# Patient Record
Sex: Male | Born: 1980 | Race: White | Hispanic: No | Marital: Married | Smoking: Never smoker
Health system: Southern US, Community
[De-identification: ages and names within clinical notes are randomized; demographics above are authoritative.]

## PROBLEM LIST (undated history)

## (undated) DIAGNOSIS — N200 Calculus of kidney: Secondary | ICD-10-CM

## (undated) HISTORY — PX: OTHER SURGICAL HISTORY: SHX169

---
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
## 1998-06-21 HISTORY — PX: NASAL SEPTUM SURGERY: SHX37

## 2007-06-22 HISTORY — PX: VASECTOMY: SHX75

## 2013-01-23 ENCOUNTER — Emergency Department: Payer: Self-pay | Admitting: Emergency Medicine

## 2013-01-23 LAB — BASIC METABOLIC PANEL
BUN: 14 mg/dL (ref 7–18)
Chloride: 104 mmol/L (ref 98–107)
Co2: 27 mmol/L (ref 21–32)
EGFR (African American): >60
EGFR (Non-African Amer.): >60
Glucose: 113 mg/dL — ABNORMAL HIGH (ref 65–99)
Osmolality: 277 (ref 275–301)

## 2013-01-23 LAB — CBC
MCH: 30.2 pg (ref 26.0–34.0)
MCV: 88 fL (ref 80–100)
Platelet: 226 10*3/uL (ref 150–440)
RBC: 4.88 10*6/uL (ref 4.40–5.90)
RDW: 12.6 % (ref 11.5–14.5)
WBC: 11.2 10*3/uL — ABNORMAL HIGH (ref 3.8–10.6)

## 2013-01-24 LAB — URINALYSIS, COMPLETE
Glucose,UR: NEGATIVE mg/dL (ref 0–75)
Hyaline Cast: 2
Ketone: NEGATIVE
Nitrite: NEGATIVE
Ph: 5 (ref 4.5–8.0)
RBC,UR: 90 /HPF (ref 0–5)
Specific Gravity: 1.025 (ref 1.003–1.030)
Squamous Epithelial: <1
WBC UR: 6 /HPF (ref 0–5)

## 2013-06-27 ENCOUNTER — Other Ambulatory Visit: Payer: Self-pay | Admitting: Urology

## 2013-06-28 ENCOUNTER — Encounter (HOSPITAL_COMMUNITY): Payer: Self-pay | Admitting: *Deleted

## 2013-07-05 ENCOUNTER — Encounter (HOSPITAL_COMMUNITY)
Admission: RE | Disposition: A | Discharge status: Home / Self Care | Payer: Self-pay | Source: Ambulatory Visit | Attending: Urology

## 2013-07-05 ENCOUNTER — Ambulatory Visit (HOSPITAL_COMMUNITY)
Admission: RE | Admit: 2013-07-05 | Discharge: 2013-07-05 | Disposition: A | Discharge status: Home / Self Care | Payer: BC Managed Care – PPO | Source: Ambulatory Visit | Attending: Urology | Admitting: Urology

## 2013-07-05 ENCOUNTER — Encounter (HOSPITAL_COMMUNITY): Payer: Self-pay | Admitting: Pharmacy Technician

## 2013-07-05 ENCOUNTER — Ambulatory Visit (HOSPITAL_COMMUNITY): Payer: BC Managed Care – PPO

## 2013-07-05 DIAGNOSIS — N200 Calculus of kidney: Secondary | ICD-10-CM | POA: Insufficient documentation

## 2013-07-05 DIAGNOSIS — Z9852 Vasectomy status: Secondary | ICD-10-CM | POA: Insufficient documentation

## 2013-07-05 HISTORY — DX: Calculus of kidney: N20.0

## 2013-07-05 SURGERY — LITHOTRIPSY, ESWL
Anesthesia: LOCAL | Laterality: Left

## 2013-07-05 MED ORDER — SODIUM CHLORIDE 0.9 % IV SOLN
INTRAVENOUS | Status: DC
  Administered 2013-07-05: 09:00:00 via INTRAVENOUS

## 2013-07-05 MED ORDER — GENTAMICIN IN SALINE 1.6-0.9 MG/ML-% IV SOLN
80.0000 mg | INTRAVENOUS | Status: AC
  Administered 2013-07-05: 80 mg via INTRAVENOUS
  Filled 2013-07-05 (×2): qty 50

## 2013-07-05 MED ORDER — OXYCODONE-ACETAMINOPHEN 5-325 MG PO TABS: 1.0000 | ORAL_TABLET | ORAL | Status: AC | PRN

## 2013-07-05 MED ORDER — DIAZEPAM 5 MG PO TABS
10.0000 mg | ORAL_TABLET | ORAL | Status: AC
  Administered 2013-07-05: 10 mg via ORAL
  Filled 2013-07-05: qty 2

## 2013-07-05 MED ORDER — SENNOSIDES-DOCUSATE SODIUM 8.6-50 MG PO TABS: 1.0000 | ORAL_TABLET | Freq: Two times a day (BID) | ORAL | Status: AC

## 2013-07-05 MED ORDER — DIPHENHYDRAMINE HCL 25 MG PO CAPS
25.0000 mg | ORAL_CAPSULE | ORAL | Status: AC
  Administered 2013-07-05: 25 mg via ORAL
  Filled 2013-07-05: qty 1

## 2013-07-05 NOTE — H&P (Signed)
Alex Cook is an 33 y.o. male.    Chief Complaint: Pre-op Left Shockwave Lithotripsy  HPI:   1 - Nephrolithiasis - Pt with first episode distal left ureteral stone 01/2013 per history presenting as hematuria and colicky left flank pain by CT in [place]. Symptoms have persisted on/ off since then without clear stone passage. CT (06/2013) with left 11mm UPJ stone with mild hydro, 600 HU, SSD 9 cm. Visible on scout images.   PMH sig for bilateral knee surgery, deviated septum surgery. No CV disease. 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  Today Alex Cook is seen to proceed with left shockwave lithotripsy for his likely intermitanly obstructing left UPJ stone. Most recent UA without infectious parameters.  Past Medical History  Diagnosis Date  . Renal stone     Past Surgical History  Procedure Laterality Date  . Hand surgery  1998  . Nasal septum surgery  2000  . Torn patella tendon repair  Bilateral 2002, 2003  . Vasectomy  2009    History reviewed. No pertinent family history. Social History:  reports that he has never smoked. He does not have any smokeless tobacco history on file. He reports that he does not drink alcohol. His drug history is not on file.  Allergies: No Known Allergies  No prescriptions prior to admission    No results found for this or any previous visit (from the past 48 hour(s)). No results found.  Review of Systems  Constitutional: Negative.  Negative for fever and chills.  HENT: Negative.   Eyes: Negative.   Respiratory: Negative.   Cardiovascular: Negative.   Gastrointestinal: Positive for nausea. Negative for vomiting.  Genitourinary: Positive for flank pain.  Musculoskeletal: Negative.   Skin: Negative.   Neurological: Negative.   Endo/Heme/Allergies: Negative.   Psychiatric/Behavioral: Negative.     There were no vitals taken for this visit. Physical Exam  Constitutional: He is oriented to person, place, and time. He appears well-developed  and well-nourished.  HENT:  Head: Normocephalic and atraumatic.  Eyes: EOM are normal. Pupils are equal, round, and reactive to light.  Neck: Normal range of motion. Neck supple.  Cardiovascular: Normal rate.   Respiratory: Effort normal.  GI: Soft. Bowel sounds are normal.  Genitourinary: Penis normal.  Moderate Left CVAT  Musculoskeletal: Normal range of motion.  Neurological: He is alert and oriented to person, place, and time.  Skin: Skin is warm and dry.  Psychiatric: He has a normal mood and affect. His behavior is normal. Judgment and thought content normal.     Assessment/Plan  1 - Nephrolithiasis - We rediscussed shockwave lithotripsy in detail as well as my "rule of 9s" with stones <749mm, less than 900 HU, and skin to stone distance <9cm having approximately 90% treatment success with single session of treatment. We then readdressed how stones that are larger, more dense, and in patients with less favorable anatomy have incrementally decreased success rates. We rediscussed risks including, bleeding, infection, hematoma, loss of kidney, need for staged therapy, need for adjunctive therapy and requirement to refrain from any anticoagulants, anti-platelet or aspirin-like products peri-procedureally.   After careful consideration, the patient has chosen to proceed today as planned.  Raegen Tarpley 07/05/2013, 6:19 AM

## 2013-07-05 NOTE — Discharge Instructions (Signed)
1 - You may have urinary urgency (bladder spasms) and bloody urine on / off and pass small fragments for up to 2 weeks. This is normal.  2 - Call MD or go to ER for fever >102, severe pain / nausea / vomiting not relieved by medications, or acute change in medical status

## 2013-07-08 ENCOUNTER — Emergency Department (HOSPITAL_COMMUNITY)
Admission: EM | Admit: 2013-07-08 | Discharge: 2013-07-08 | Disposition: A | Discharge status: Home / Self Care | Payer: BC Managed Care – PPO | Attending: Emergency Medicine | Admitting: Emergency Medicine

## 2013-07-08 ENCOUNTER — Encounter (HOSPITAL_COMMUNITY): Payer: Self-pay | Admitting: Emergency Medicine

## 2013-07-08 ENCOUNTER — Emergency Department (HOSPITAL_COMMUNITY): Payer: BC Managed Care – PPO

## 2013-07-08 DIAGNOSIS — G8929 Other chronic pain: Secondary | ICD-10-CM | POA: Insufficient documentation

## 2013-07-08 DIAGNOSIS — R112 Nausea with vomiting, unspecified: Secondary | ICD-10-CM | POA: Insufficient documentation

## 2013-07-08 DIAGNOSIS — Z79899 Other long term (current) drug therapy: Secondary | ICD-10-CM | POA: Insufficient documentation

## 2013-07-08 DIAGNOSIS — Z9889 Other specified postprocedural states: Secondary | ICD-10-CM | POA: Insufficient documentation

## 2013-07-08 DIAGNOSIS — N200 Calculus of kidney: Secondary | ICD-10-CM

## 2013-07-08 DIAGNOSIS — R000 Tachycardia, unspecified: Secondary | ICD-10-CM | POA: Insufficient documentation

## 2013-07-08 DIAGNOSIS — N179 Acute kidney failure, unspecified: Secondary | ICD-10-CM

## 2013-07-08 DIAGNOSIS — R109 Unspecified abdominal pain: Secondary | ICD-10-CM

## 2013-07-08 LAB — BASIC METABOLIC PANEL
BUN: 18 mg/dL (ref 6–23)
CO2: 26 mEq/L (ref 19–32)
Calcium: 9.6 mg/dL (ref 8.4–10.5)
Chloride: 94 mEq/L — ABNORMAL LOW (ref 96–112)
Creatinine, Ser: 1.7 mg/dL — ABNORMAL HIGH (ref 0.50–1.35)
GFR, EST AFRICAN AMERICAN: 59 mL/min — AB (ref >90)
GFR, EST NON AFRICAN AMERICAN: 51 mL/min — AB (ref >90)
Glucose, Bld: 95 mg/dL (ref 70–99)
POTASSIUM: 3.8 meq/L (ref 3.7–5.3)
SODIUM: 136 meq/L — AB (ref 137–147)

## 2013-07-08 LAB — URINALYSIS, ROUTINE W REFLEX MICROSCOPIC
Bilirubin Urine: NEGATIVE
Glucose, UA: NEGATIVE mg/dL
Ketones, ur: >80 mg/dL — AB
Leukocytes, UA: NEGATIVE
NITRITE: NEGATIVE
Protein, ur: NEGATIVE mg/dL
SPECIFIC GRAVITY, URINE: 1.025 (ref 1.005–1.030)
UROBILINOGEN UA: 1 mg/dL (ref 0.0–1.0)
pH: 6 (ref 5.0–8.0)

## 2013-07-08 LAB — URINE MICROSCOPIC-ADD ON

## 2013-07-08 MED ORDER — KETOROLAC TROMETHAMINE 30 MG/ML IJ SOLN
30.0000 mg | Freq: Once | INTRAMUSCULAR | Status: AC
  Administered 2013-07-08: 30 mg via INTRAVENOUS
  Filled 2013-07-08: qty 1

## 2013-07-08 MED ORDER — HYDROMORPHONE HCL PF 1 MG/ML IJ SOLN
1.0000 mg | Freq: Once | INTRAMUSCULAR | Status: AC
  Administered 2013-07-08: 1 mg via INTRAVENOUS
  Filled 2013-07-08: qty 1

## 2013-07-08 MED ORDER — SODIUM CHLORIDE 0.9 % IV BOLUS (SEPSIS)
1000.0000 mL | Freq: Once | INTRAVENOUS | Status: AC
  Administered 2013-07-08: 1000 mL via INTRAVENOUS

## 2013-07-08 MED ORDER — ONDANSETRON HCL 4 MG/2ML IJ SOLN
4.0000 mg | Freq: Once | INTRAMUSCULAR | Status: AC
  Administered 2013-07-08: 4 mg via INTRAVENOUS
  Filled 2013-07-08: qty 2

## 2013-07-08 NOTE — ED Notes (Signed)
Pt had lithotripsy done Thursday and he has been having severe kidney pain for 12 hours

## 2013-07-08 NOTE — ED Provider Notes (Signed)
CSN: [id number]     Arrival date & time 07/08/13  0415 History   First MD Initiated Contact with Patient 07/08/13 0430     Chief Complaint  Patient presents with  . Flank Pain   (Consider location/radiation/quality/duration/timing/severity/associated sxs/prior Treatment) HPI Comments: 33 yo male with chronic back pain, non smoker presents with left flank pain, intermittent, severe, similar to previous pain since [facility].  No fevers.  Decreased urine output.  Nothing improves pain, tried narcotics.   Patient is a 33 y.o. male presenting with flank pain. The history is provided by the patient.  Flank Pain Pertinent negatives include no chest pain, no abdominal pain, no headaches and no shortness of breath.    Past Medical History  Diagnosis Date  . Renal stone    Past Surgical History  Procedure Laterality Date  . Hand surgery  1998  . Nasal septum surgery  2000  . Torn patella tendon repair  Bilateral 2002, 2003  . Vasectomy  2009   History reviewed. No pertinent family history. History  Substance Use Topics  . Smoking status: Never Smoker   . Smokeless tobacco: Not on file  . Alcohol Use: No    Review of Systems  Constitutional: Negative for fever and chills.  Respiratory: Negative for shortness of breath.   Cardiovascular: Negative for chest pain.  Gastrointestinal: Positive for nausea and vomiting. Negative for abdominal pain.  Genitourinary: Positive for flank pain. Negative for dysuria.  Musculoskeletal: Negative for back pain, neck pain and neck stiffness.  Skin: Negative for rash.  Neurological: Negative for light-headedness and headaches.    Allergies  Review of patient's allergies indicates no known allergies.  Home Medications   Current Outpatient Rx  Name  Route  Sig  Dispense  Refill  . cholecalciferol (VITAMIN D) 1000 UNITS tablet   Oral   Take 1,000 Units by mouth every morning.         . cyanocobalamin 500 MCG tablet  Oral   Take 500 mcg by mouth every morning.         Marland Kitchen. oxyCODONE-acetaminophen (ROXICET) 5-325 MG per tablet   Oral   Take 1-2 tablets by mouth every 4 (four) hours as needed for severe pain. From Kidney Stones   40 tablet   0   . senna-docusate (SENOKOT-S) 8.6-50 MG per tablet   Oral   Take 1 tablet by mouth 2 (two) times daily. While taking pain meds to prevent constipation   30 tablet   0    BP 142/85  Pulse 108  Temp(Src) 98.4 F (36.9 C)  Resp 20  SpO2 98% Physical Exam  Nursing note and vitals reviewed. Constitutional: He is oriented to person, place, and time. He appears well-developed and well-nourished.  HENT:  Head: Normocephalic and atraumatic.  Eyes: Conjunctivae are normal. Right eye exhibits no discharge. Left eye exhibits no discharge.  Neck: Normal range of motion. Neck supple. No tracheal deviation present.  Cardiovascular: Regular rhythm.  Tachycardia present.   Pulmonary/Chest: Effort normal and breath sounds normal.  Abdominal: Soft. He exhibits no distension. There is tenderness (mild left lower flank). There is no guarding.  Musculoskeletal: He exhibits tenderness (mild left flank). He exhibits no edema.  Neurological: He is alert and oriented to person, place, and time.  Skin: Skin is warm. No rash noted.  Psychiatric: He has a normal mood and affect.    ED Course  Procedures (including critical care time) Emergency Focused Ultrasound Exam Limited retroperitoneal  ultrasound of kidneys  Performed and interpreted by Dr. Jodi Mourning Indication: flank pain Focused abdominal ultrasound with both kidneys imaged in transverse and longitudinal planes in real-time. Interpretation: left mild hydronephrosis visualized. No cysts seen. Images archived electronically  Labs Review Labs Reviewed  URINALYSIS, ROUTINE W REFLEX MICROSCOPIC - Abnormal; Notable for the following:    APPearance CLOUDY (*)    Hgb urine dipstick MODERATE (*)    Ketones, ur >80 (*)     All other components within normal limits  BASIC METABOLIC PANEL - Abnormal; Notable for the following:    Sodium 136 (*)    Chloride 94 (*)    Creatinine, Ser 1.70 (*)    GFR calc non Af Amer 51 (*)    GFR calc Af Amer 59 (*)    All other components within normal limits  URINE MICROSCOPIC-ADD ON   Imaging Review Dg Abd 1 View  07/08/2013   CLINICAL DATA:  Severe left flank pain; recent lithotripsy.  EXAM: ABDOMEN - 1 VIEW  COMPARISON:  Abdominal radiograph performed 07/05/2013  FINDINGS: The visualized bowel gas pattern is unremarkable. Scattered air and stool filled loops of colon are seen; no abnormal dilatation of small bowel loops is seen to suggest small bowel obstruction. No free intra-abdominal air is identified, though evaluation for free air is limited on a single supine view.  Linear density at the left hemipelvis may reflect steinstrasse, status post lithotripsy.  The visualized osseous structures are within normal limits; the sacroiliac joints are unremarkable in appearance. The visualized lung bases are essentially clear.  IMPRESSION: 1. Linear density at the left hemipelvis may reflect steinstrasse, status post lithotripsy. 2. Unremarkable bowel gas pattern; no free intra-abdominal air seen.   Electronically Signed   By: Roanna Raider M.D.   On: 07/08/2013 06:37    EKG Interpretation   None       MDM   1. Left flank pain   2. ARF (acute renal failure)   3. Kidney stone on left side    Clinically kidney stone on left.  Bedside US mild hydronephrosis.  Pain meds, fluids, zofran. Pt sxs resolved in ED, well appearing on recheck, afebrile.  Stressed close fup with urology in next two days. Xray reviewed by myself.  Results and differential diagnosis were discussed with the patient. Close follow up outpatient was discussed, patient comfortable with the plan.         Enid Skeens, MD 07/08/13 [phone]

## 2013-07-08 NOTE — Discharge Instructions (Signed)
Take your pain medicines as discussed. Call urology for follow up.  If you were given medicines take as directed.  If you are on coumadin or contraceptives realize their levels and effectiveness is altered by many different medicines.  If you have any reaction (rash, tongues swelling, other) to the medicines stop taking and see a physician.   Please follow up as directed and return to the ER or see a physician for new or worsening symptoms (fevers, vomiting, uncontrollable pain).  Thank you.

## 2014-10-27 IMAGING — CT CT STONE STUDY
1 of 2 series · 15 of 32 positions shown, 19 images · non-contrast
Comparison: none

REASON FOR EXAM: Left flank pain, hematuria
COMMENTS:

[Series 2: 3mm soft tissue · axial · 0.84mm/px · z∈[-552,-76]mm · 15 of 175 slices shown, 19 images]
[im 8/175  soft-tissue]
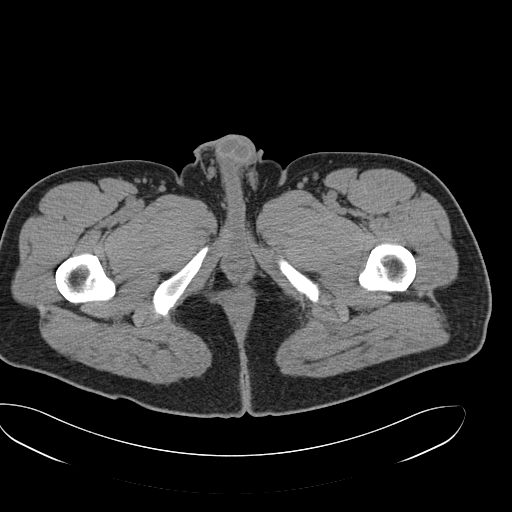
[im 8/175  bone]
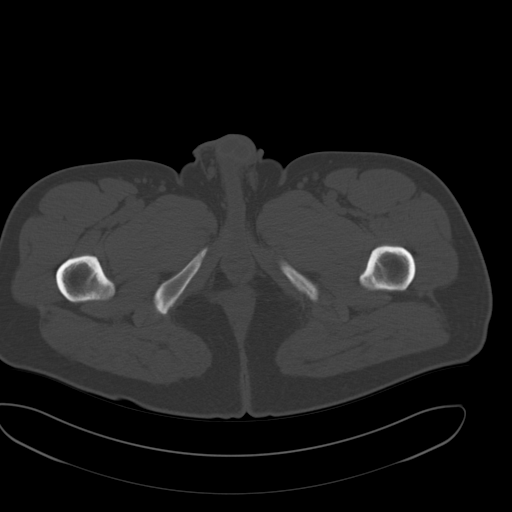
[im 22/175  soft-tissue]
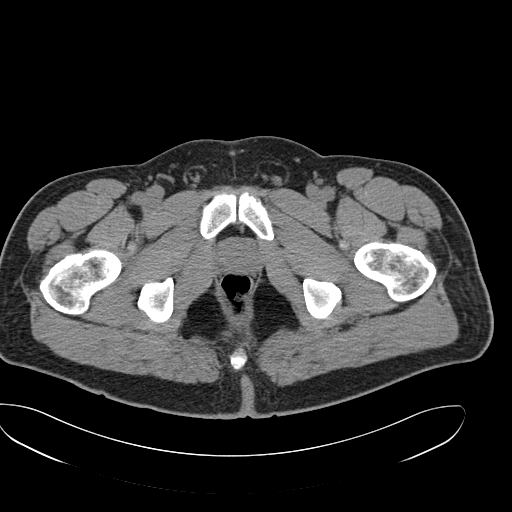
[im 37/175  soft-tissue]
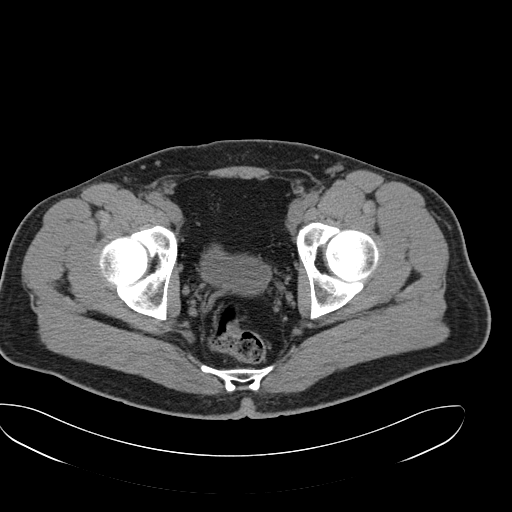
[im 51/175  soft-tissue]
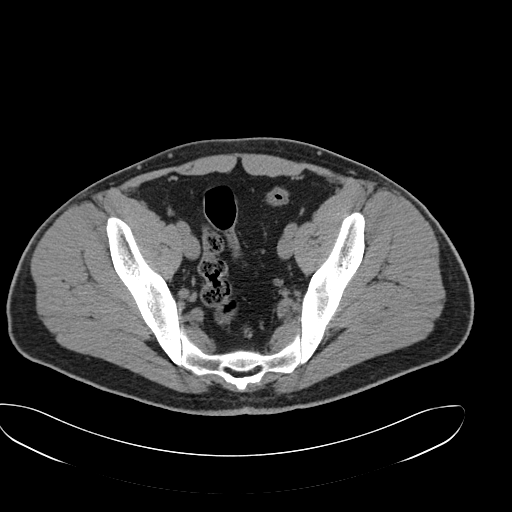
[im 59/175  soft-tissue]
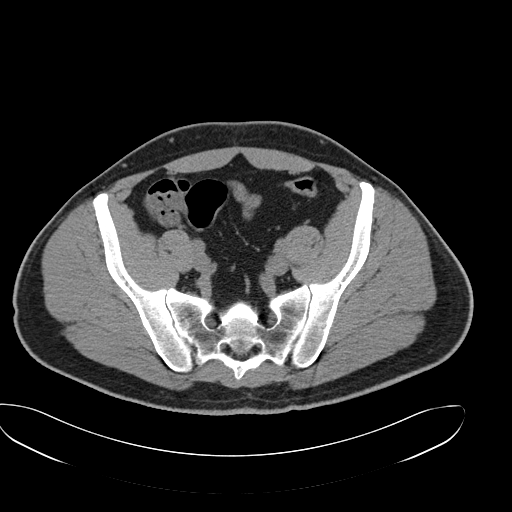
[im 73/175  soft-tissue]
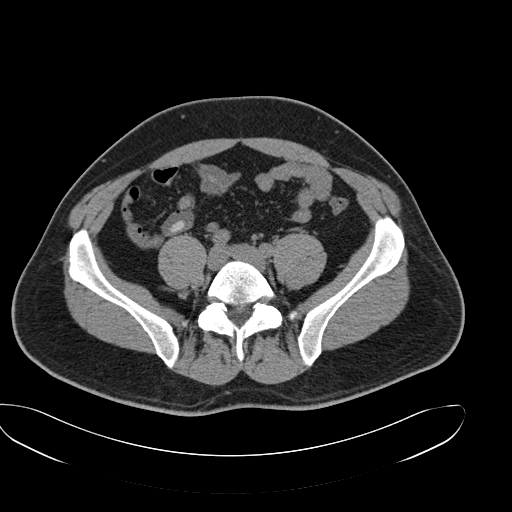
[im 88/175  soft-tissue]
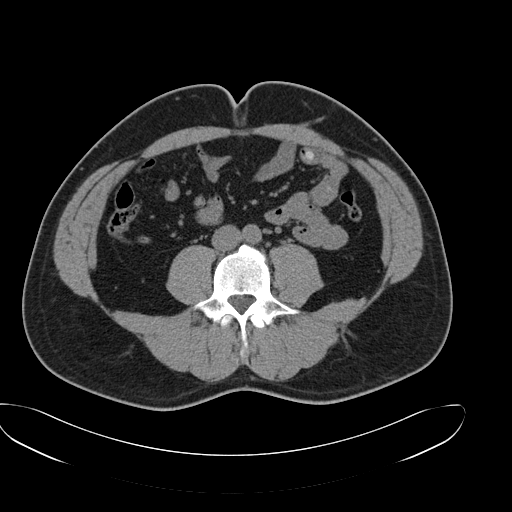
[im 102/175  soft-tissue]
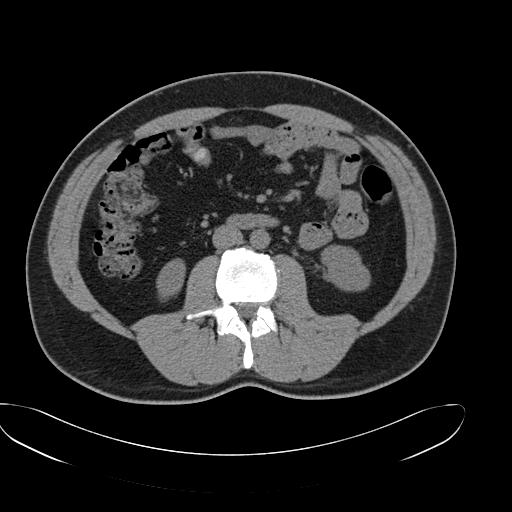
[im 117/175  soft-tissue]
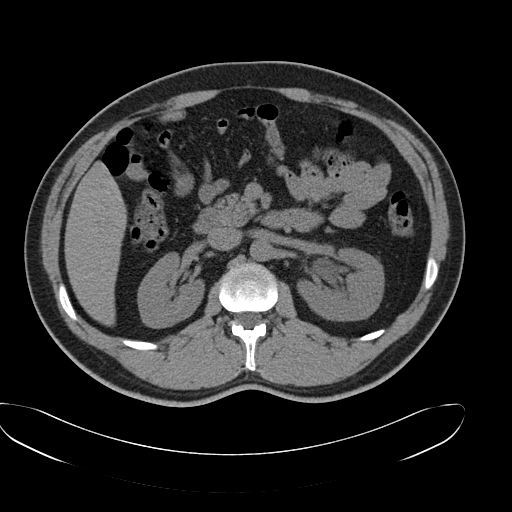
[im 117/175  bone]
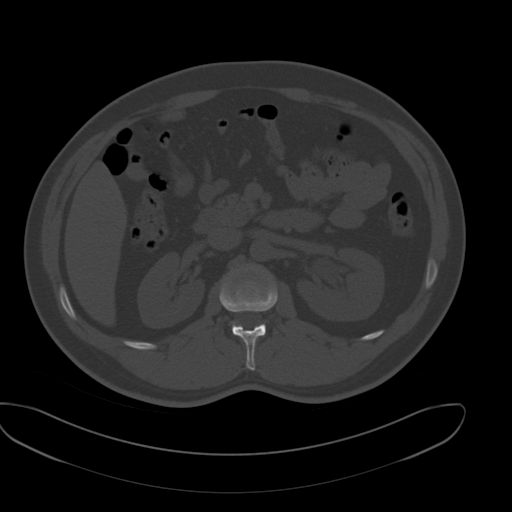
[im 124/175  soft-tissue]
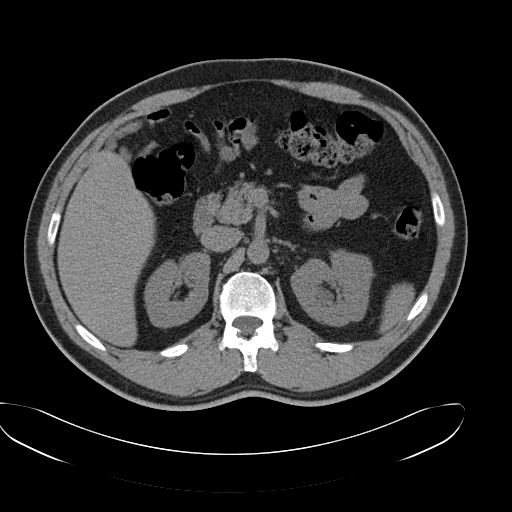
[im 138/175  soft-tissue]
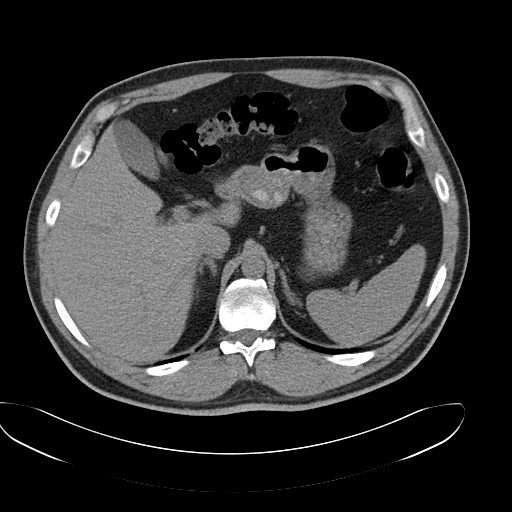
[im 146/175  lung]
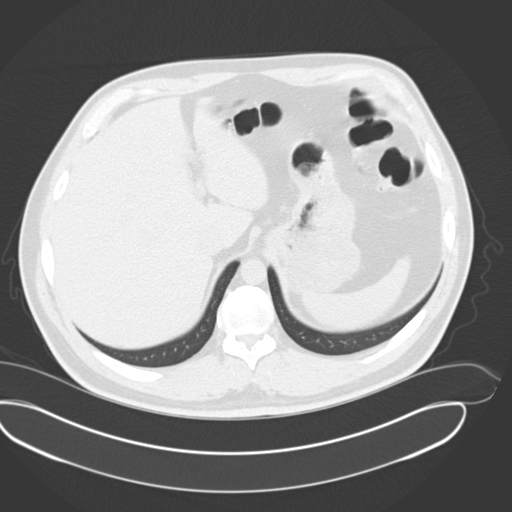
[im 153/175  soft-tissue]
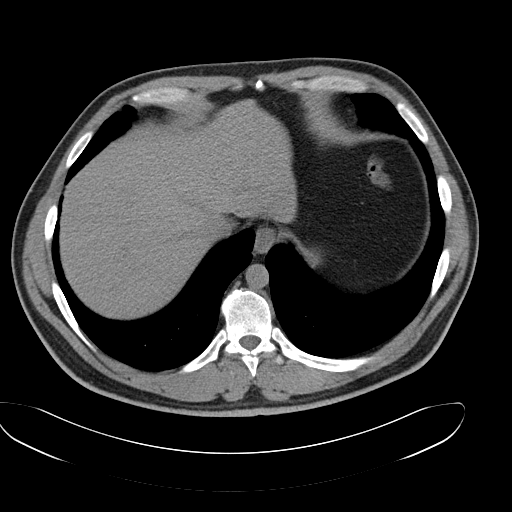
[im 153/175  lung]
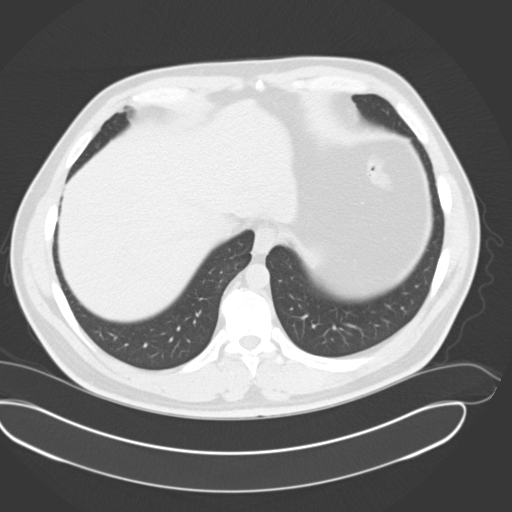
[im 160/175  lung]
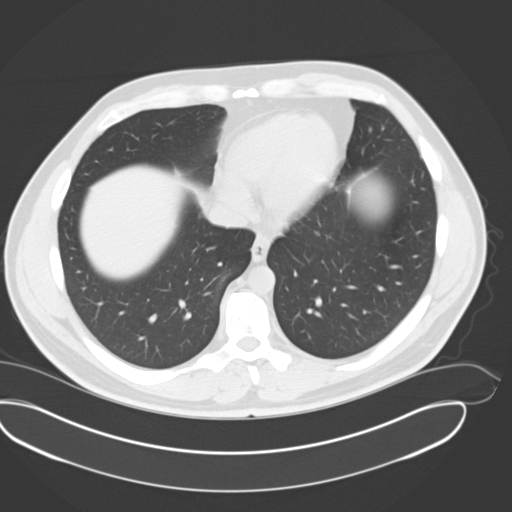
[im 167/175  soft-tissue]
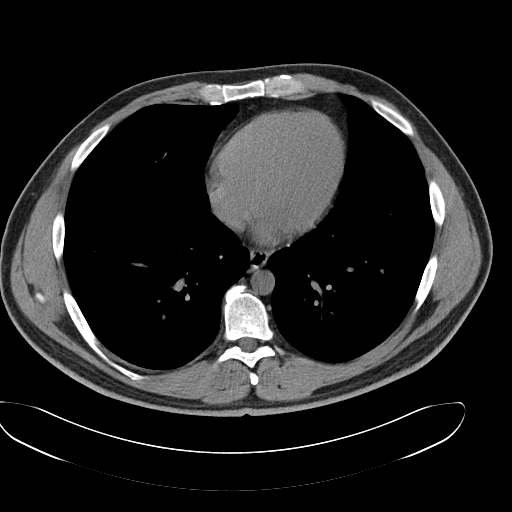
[im 167/175  lung]
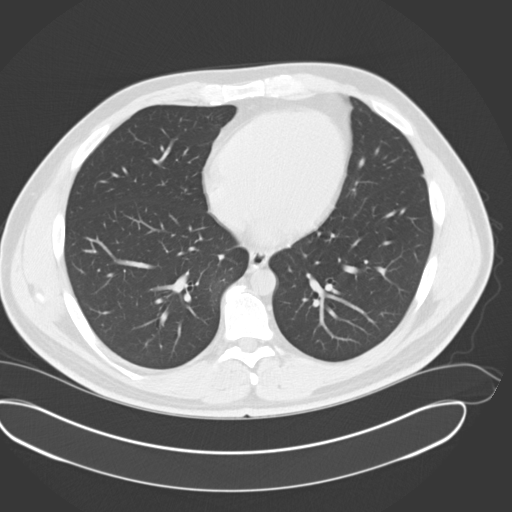

[15 of 32 positions shown; findings below may reference images not displayed]

PROCEDURE:     CT  - CT ABDOMEN /PELVIS WO (STONE)  - January 23, 2013 [DATE]

RESULT:     Axial noncontrast CT scanning was performed through the abdomen
and pelvis with reconstructions at 3 mm intervals and slice thicknesses.

There is mild hydronephrosis on the left secondary to a 4 x 8mm diameter
stone in the renal pelvis. No other stones are demonstrated on the left. On
the right no stones are demonstrated. There is no evidence of obstruction.
The partially distended urinary bladder is normal in appearance.

The liver, gallbladder, pancreas, spleen, partially distended stomach, and
adrenal glands are normal in appearance. The caliber of the abdominal aorta
is normal. The unopacified loops of small and large bowel exhibit no
evidence of ileus nor obstruction. A normal calibered uninflamed appearing
appendix is demonstrated. There is a small fat containing umbilical hernia.

The lung bases are clear. The lumbar vertebral bodies are preserved in
height.
IMPRESSION: 1. There is mild left-sided hydronephrosis secondary to a calcified or by
8mm diameter ureteropelvic junction stone. No other stones are demonstrated
on the left. There may be a sub mm nonobstructing lower pole stone on the
right on image 89.
2. There is no acute hepatobiliary abnormality nor acute bowel abnormality.

A preliminary report was sent to the [HOSPITAL] the conclusion
of the study.

[REDACTED]

## 2017-11-21 ENCOUNTER — Encounter: Payer: Self-pay | Admitting: Podiatry

## 2017-11-21 ENCOUNTER — Ambulatory Visit: Payer: BLUE CROSS/BLUE SHIELD | Admitting: Podiatry

## 2017-11-21 ENCOUNTER — Ambulatory Visit (INDEPENDENT_AMBULATORY_CARE_PROVIDER_SITE_OTHER): Payer: BLUE CROSS/BLUE SHIELD

## 2017-11-21 DIAGNOSIS — M2141 Flat foot [pes planus] (acquired), right foot: Secondary | ICD-10-CM

## 2017-11-21 DIAGNOSIS — M722 Plantar fascial fibromatosis: Secondary | ICD-10-CM

## 2017-11-21 DIAGNOSIS — M2142 Flat foot [pes planus] (acquired), left foot: Secondary | ICD-10-CM

## 2017-11-23 DIAGNOSIS — M722 Plantar fascial fibromatosis: Secondary | ICD-10-CM | POA: Insufficient documentation

## 2017-11-23 DIAGNOSIS — M2142 Flat foot [pes planus] (acquired), left foot: Principal | ICD-10-CM

## 2017-11-23 DIAGNOSIS — M2141 Flat foot [pes planus] (acquired), right foot: Secondary | ICD-10-CM | POA: Insufficient documentation

## 2017-11-23 NOTE — Progress Notes (Signed)
Subjective:   Patient ID: Catha NottinghamMichael K Rogstad, male   DOB: 37 y.o.   MRN: [id number]   HPI 37 year old male presents the office today for concerns of flat feet that he has had for majority of his life.  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  He states it is difficult for him to wear different shoes.  Denies any recent injury or trauma.  No numbness or tingling.  No swelling he has noticed.  No other concerns.   Review of Systems  All other systems reviewed and are negative.  Past Medical History:  Diagnosis Date  . Renal stone     Past Surgical History:  Procedure Laterality Date  . HAND SURGERY  1998  . NASAL SEPTUM SURGERY  2000  . torn patella tendon repair  Bilateral 2002, 2003  . VASECTOMY  2009     Current Outpatient Medications:  .  cholecalciferol (VITAMIN D) 1000 UNITS tablet, Take 1,000 Units by mouth every morning., Disp: , Rfl:  .  cyanocobalamin 500 MCG tablet, Take 500 mcg by mouth every morning., Disp: , Rfl:  .  oxyCODONE-acetaminophen (ROXICET) 5-325 MG per tablet, Take 1-2 tablets by mouth every 4 (four) hours as needed for severe pain. From Kidney Stones (Patient not taking: Reported on 11/21/2017), Disp: 40 tablet, Rfl: 0 .  senna-docusate (SENOKOT-S) 8.6-50 MG per tablet, Take 1 tablet by mouth 2 (two) times daily. While taking pain meds to prevent constipation (Patient not taking: Reported on 11/21/2017), Disp: 30 tablet, Rfl: 0  No Known Allergies       Objective:  Physical Exam  General: AAO x3, NAD  Dermatological: Skin is warm, dry and supple bilateral. Nails x 10 are well manicured; remaining integument appears unremarkable at this time. There are no open sores, no preulcerative lesions, no rash or signs of infection present.  Vascular: Dorsalis Pedis artery and Posterior Tibial artery pedal pulses are 2/4 bilateral with immedate capillary  fill time. Pedal hair growth present. No varicosities and no lower extremity edema present bilateral. There is no pain with calf compression, swelling, warmth, erythema.   Neruologic: Grossly intact via light touch bilateral. Vibratory intact via tuning fork bilateral. Protective threshold with Semmes Wienstein monofilament intact to all pedal sites bilateral. Negative tinel sign.   Musculoskeletal: Decrease in medial arch heigh.  Subjectively along medial plantar aspect of the calcaneus along the insertion of plantar fascial discomfort, not expensing any significant pain.  Also upon palpation medial band of plantar fascia the left foot he gets discomfort but again there is no pain.  Plantar fascia appears to be intact.  There is no other areas of tenderness.  Achilles tendon appears to be intact.  Muscular strength 5/5 in all groups tested bilateral.  Gait: Unassisted, Nonantalgic.       Assessment:   37 year old male with bilateral plantar fasciitis symptoms, flatfeet     Plan:  -Treatment options discussed including all alternatives, risks, and complications -Etiology of symptoms were discussed -X-rays were obtained and reviewed with the patient.  No evidence of acute fracture or stress fracture.  Joint space maintained.  Flatfoot deformity is present -He was to proceed with orthotics. He was molded today by Raiford Nobleick. We also discussed shoe changes.   Vivi BarrackMatthew R Wagoner DPM

## 2017-12-13 ENCOUNTER — Ambulatory Visit: Payer: BLUE CROSS/BLUE SHIELD | Admitting: Orthotics

## 2017-12-13 DIAGNOSIS — M2141 Flat foot [pes planus] (acquired), right foot: Secondary | ICD-10-CM

## 2017-12-13 DIAGNOSIS — M2142 Flat foot [pes planus] (acquired), left foot: Principal | ICD-10-CM

## 2017-12-13 DIAGNOSIS — M722 Plantar fascial fibromatosis: Secondary | ICD-10-CM

## 2017-12-13 NOTE — Progress Notes (Signed)
Patient came in today to pick up custom made foot orthotics.  The goals were accomplished and the patient reported no dissatisfaction with said orthotics.  Patient was advised of breakin period and how to report any issues.
